# Patient Record
Sex: Male | Born: 1980 | Race: White | Hispanic: No | Marital: Married | Smoking: Current some day smoker
Health system: Southern US, Community
[De-identification: ages and names within clinical notes are randomized; demographics above are authoritative.]

## PROBLEM LIST (undated history)

## (undated) DIAGNOSIS — J45909 Unspecified asthma, uncomplicated: Secondary | ICD-10-CM

## (undated) HISTORY — PX: KNEE SURGERY: SHX244

---

## 2018-04-05 ENCOUNTER — Emergency Department (HOSPITAL_COMMUNITY)
Admission: EM | Admit: 2018-04-05 | Discharge: 2018-04-05 | Disposition: A | Discharge status: Home / Self Care | Payer: Non-veteran care | Attending: Emergency Medicine | Admitting: Emergency Medicine

## 2018-04-05 ENCOUNTER — Emergency Department (HOSPITAL_COMMUNITY): Payer: Non-veteran care

## 2018-04-05 ENCOUNTER — Encounter (HOSPITAL_COMMUNITY): Payer: Self-pay

## 2018-04-05 DIAGNOSIS — J45909 Unspecified asthma, uncomplicated: Secondary | ICD-10-CM | POA: Insufficient documentation

## 2018-04-05 DIAGNOSIS — R1013 Epigastric pain: Secondary | ICD-10-CM | POA: Insufficient documentation

## 2018-04-05 DIAGNOSIS — F17210 Nicotine dependence, cigarettes, uncomplicated: Secondary | ICD-10-CM | POA: Diagnosis not present

## 2018-04-05 DIAGNOSIS — R109 Unspecified abdominal pain: Secondary | ICD-10-CM | POA: Diagnosis not present

## 2018-04-05 HISTORY — DX: Unspecified asthma, uncomplicated: J45.909

## 2018-04-05 LAB — COMPREHENSIVE METABOLIC PANEL
ALK PHOS: 75 U/L (ref 38–126)
ALT: 102 U/L — ABNORMAL HIGH (ref 0–44)
ANION GAP: 11 (ref 5–15)
AST: 49 U/L — ABNORMAL HIGH (ref 15–41)
Albumin: 4.4 g/dL (ref 3.5–5.0)
BUN: 8 mg/dL (ref 6–20)
CALCIUM: 9.4 mg/dL (ref 8.9–10.3)
CO2: 24 mmol/L (ref 22–32)
Chloride: 105 mmol/L (ref 98–111)
Creatinine, Ser: 1.24 mg/dL (ref 0.61–1.24)
Glucose, Bld: 100 mg/dL — ABNORMAL HIGH (ref 70–99)
Potassium: 4 mmol/L (ref 3.5–5.1)
SODIUM: 140 mmol/L (ref 135–145)
TOTAL PROTEIN: 7.4 g/dL (ref 6.5–8.1)
Total Bilirubin: 1.6 mg/dL — ABNORMAL HIGH (ref 0.3–1.2)

## 2018-04-05 LAB — TYPE AND SCREEN
ABO/RH(D): B NEG
ANTIBODY SCREEN: NEGATIVE

## 2018-04-05 LAB — CBC
HCT: 51.2 % (ref 39.0–52.0)
HEMOGLOBIN: 17.5 g/dL — AB (ref 13.0–17.0)
MCH: 31.7 pg (ref 26.0–34.0)
MCHC: 34.2 g/dL (ref 30.0–36.0)
MCV: 92.8 fL (ref 78.0–100.0)
Platelets: 131 10*3/uL — ABNORMAL LOW (ref 150–400)
RBC: 5.52 MIL/uL (ref 4.22–5.81)
RDW: 12.9 % (ref 11.5–15.5)
WBC: 7 10*3/uL (ref 4.0–10.5)

## 2018-04-05 LAB — POC OCCULT BLOOD, ED: FECAL OCCULT BLD: NEGATIVE

## 2018-04-05 LAB — ABO/RH: ABO/RH(D): B NEG

## 2018-04-05 MED ORDER — FAMOTIDINE 20 MG PO TABS: 20.0000 mg | ORAL_TABLET | Freq: Two times a day (BID) | ORAL | 0 refills | Status: AC

## 2018-04-05 MED ORDER — SUCRALFATE 1 G PO TABS: 1.0000 g | ORAL_TABLET | Freq: Four times a day (QID) | ORAL | 0 refills | Status: AC | PRN

## 2018-04-05 MED ORDER — FAMOTIDINE IN NACL 20-0.9 MG/50ML-% IV SOLN
20.0000 mg | Freq: Once | INTRAVENOUS | Status: AC
  Administered 2018-04-05: 20 mg via INTRAVENOUS
  Filled 2018-04-05: qty 50

## 2018-04-05 MED ORDER — ONDANSETRON 4 MG PO TBDP
4.0000 mg | ORAL_TABLET | Freq: Once | ORAL | Status: AC
  Administered 2018-04-05: 4 mg via ORAL
  Filled 2018-04-05: qty 1

## 2018-04-05 MED ORDER — GI COCKTAIL ~~LOC~~
30.0000 mL | Freq: Once | ORAL | Status: AC
  Administered 2018-04-05: 30 mL via ORAL
  Filled 2018-04-05: qty 30

## 2018-04-05 NOTE — Discharge Instructions (Addendum)
You were evaluated in the Emergency Department and after careful evaluation, we did not find any emergent condition requiring admission or further testing in the hospital.  Your symptoms today seem to be due to inflammation of the stomach.  Your pain might also be related to sludge in her gallbladder.  Please use the medications as directed and follow-up with your primary care provider if not improved in 1 week.  Please return to the Emergency Department if you experience any worsening of your condition.  We encourage you to follow up with a primary care provider.  Thank you for allowing us to be a part of your care.

## 2018-04-05 NOTE — ED Notes (Signed)
Patient transported to Ultrasound 

## 2018-04-05 NOTE — ED Triage Notes (Signed)
Pt states that he has been having diarrhea for the past 8-9 unrelieved by home medications with upper abd pain, worse after eating. Reports n/v and black stools for the past 3-4 days

## 2018-04-05 NOTE — ED Provider Notes (Signed)
[facility] Provider Note MRN:  [id number]  Arrival date & time: 04/05/18     Chief Complaint   Abdominal Pain and Rectal Bleeding   History of Present Illness   Jeffery Sherman is a 37 y.o. year-old male with no pertinent past medical history presenting to the ED with chief complaint of abdominal pain.  The pain is located in the epigastrium.  The pain began 3 days ago, gradual onset, pain is constant but worsened by meals.  Preceded by 3 to 4 days of nonbloody diarrhea.  No recent antibiotics, no recent travel, denies fevers, no chest pain or shortness of breath.  States that a few days ago he began taking Pepto-Bismol.  Since that time his stool has become a darker more black-colored.  Denies dizziness or lightheadedness.  Review of Systems  A complete 10 system review of systems was obtained and all systems are negative except as noted in the HPI and PMH.   Patient's Health History    Past Medical History:  Diagnosis Date  . Asthma     Past Surgical History:  Procedure Laterality Date  . KNEE SURGERY      No family history on file.  Social History   Socioeconomic History  . Marital status: Married    Spouse name: Not on file  . Number of children: Not on file  . Years of education: Not on file  . Highest education level: Not on file  Occupational History  . Not on file  Social Needs  . Financial resource strain: Not on file  . Food insecurity:    Worry: Not on file    Inability: Not on file  . Transportation needs:    Medical: Not on file    Non-medical: Not on file  Tobacco Use  . Smoking status: Current Some Day Smoker  . Smokeless tobacco: Never Used  Substance and Sexual Activity  . Alcohol use: Yes  . Drug use: Never  . Sexual activity: Not on file  Lifestyle  . Physical activity:    Days per week: Not on file    Minutes per session: Not on file  . Stress: Not on file  Relationships  . Social connections:   Talks on phone: Not on file    Gets together: Not on file    Attends religious service: Not on file    Active member of club or organization: Not on file    Attends meetings of clubs or organizations: Not on file    Relationship status: Not on file  . Intimate partner violence:    Fear of current or ex partner: Not on file    Emotionally abused: Not on file    Physically abused: Not on file    Forced sexual activity: Not on file  Other Topics Concern  . Not on file  Social History Narrative  . Not on file     Physical Exam  Vital Signs and Nursing Notes reviewed Vitals:   04/05/18 2300 04/05/18 2315  BP:  131/85  Pulse: 69 (!) 58  Resp:  16  Temp:    SpO2: 97% 98%    CONSTITUTIONAL: Well-appearing, NAD NEURO:  Alert and oriented x 3, no focal deficits EYES:  eyes equal and reactive ENT/NECK:  no LAD, no JVD CARDIO: Regular rate, well-perfused, normal S1 and S2 PULM:  CTAB no wheezing or rhonchi GI/GU:  normal bowel sounds, non-distended, mild epigastric tenderness MSK/SPINE:  No gross deformities, no edema  SKIN:  no rash, atraumatic PSYCH:  Appropriate speech and behavior  Diagnostic and Interventional Summary    EKG Interpretation  Date/Time:    Ventricular Rate:    PR Interval:    QRS Duration:   QT Interval:    QTC Calculation:   R Axis:     Text Interpretation:        Labs Reviewed  COMPREHENSIVE METABOLIC PANEL - Abnormal; Notable for the following components:      Result Value   Glucose, Bld 100 (*)    AST 49 (*)    ALT 102 (*)    Total Bilirubin 1.6 (*)    All other components within normal limits  CBC - Abnormal; Notable for the following components:   Hemoglobin 17.5 (*)    Platelets 131 (*)    All other components within normal limits  POC OCCULT BLOOD, ED  POC OCCULT BLOOD, ED  TYPE AND SCREEN  ABO/RH    US Abdomen Limited RUQ  Final Result      Medications  ondansetron (ZOFRAN-ODT) disintegrating tablet 4 mg (4 mg Oral Given  04/05/18 2059)  famotidine (PEPCID) IVPB 20 mg premix (0 mg Intravenous Stopped 04/05/18 2309)  gi cocktail (Maalox,Lidocaine,Donnatal) (30 mLs Oral Given 04/05/18 2152)     Procedures Critical Care  ED Course and Medical Decision Making  I have reviewed the triage vital signs and the nursing notes.  Pertinent labs & imaging results that were available during my care of the patient were reviewed by me and considered in my medical decision making (see below for details). Clinical Course as of Apr 05 2321  Sat Apr 05, 2018  2522 37 year old male presenting with 1 week diarrhea, few days of epigastric abdominal pain worse with meals.  Question of gallbladder disease versus gastric ulcer.  Patient endorses more black stools lately but only after starting Pepto-Bismol.  Will obtain labs, right upper quadrant ultrasound, Hemoccult.  Very well-appearing, vital signs stable.   [MB]  2314 Labs reveal very mild elevation in LFTs, ultrasound reveals sludge in the gallbladder but no findings to suggest cholecystitis.  Hemoccult negative.  Little to no concern for significant GI bleed or gastric ulcer.  Biliary colic versus more benign gastritis.  Given prescription for Carafate and Pepcid.  Discussed the possibility of fatty liver disease and encouraged weight loss.  We will follow-up with his primary care provider in the next 1 to 2 weeks.After the discussed management above, the patient was determined to be safe for discharge.  The patient was in agreement with this plan and all questions regarding their care were answered.  ED return precautions were discussed and the patient will return to the ED with any significant worsening of condition.   [MB]    Clinical Course User Index [MB] Pilar Plate Elmer Sow, MD     Elmer Sow. Pilar Plate, MD [facility] Emergency Medicine [facility] [email or web address]  Final Clinical Impressions(s) / ED Diagnoses     ICD-10-CM   1. Epigastric pain R10.13   2.  Abdominal pain R10.9 US Abdomen Limited RUQ    US Abdomen Limited RUQ    ED Discharge Orders         Ordered    sucralfate (CARAFATE) 1 g tablet  4 times daily PRN     04/05/18 2312    famotidine (PEPCID) 20 MG tablet  2 times daily     04/05/18 2312  Sabas Sous, MD 04/05/18 2322

## 2018-12-30 IMAGING — US US ABDOMEN LIMITED
1 series · 14 of 25 positions shown · non-contrast
Comparison: None.

CLINICAL DATA: Abdominal pain for 1 week

EXAM:
ULTRASOUND ABDOMEN LIMITED RIGHT UPPER QUADRANT

[Series 1: us abdomen limited · 0.27mm/px · 14 of 68 slices shown]
[im 1/68]
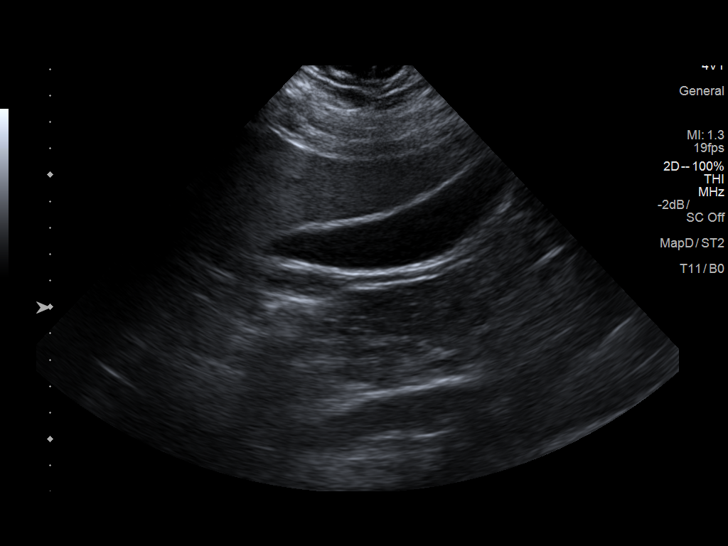
[im 6/68]
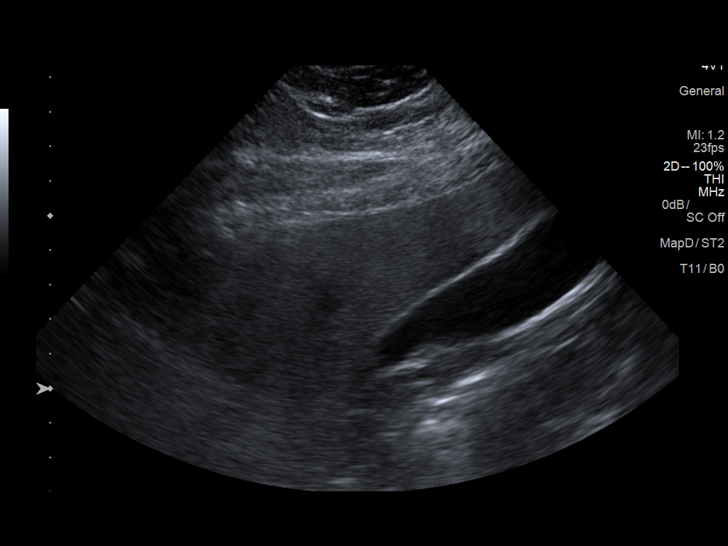
[im 12/68]
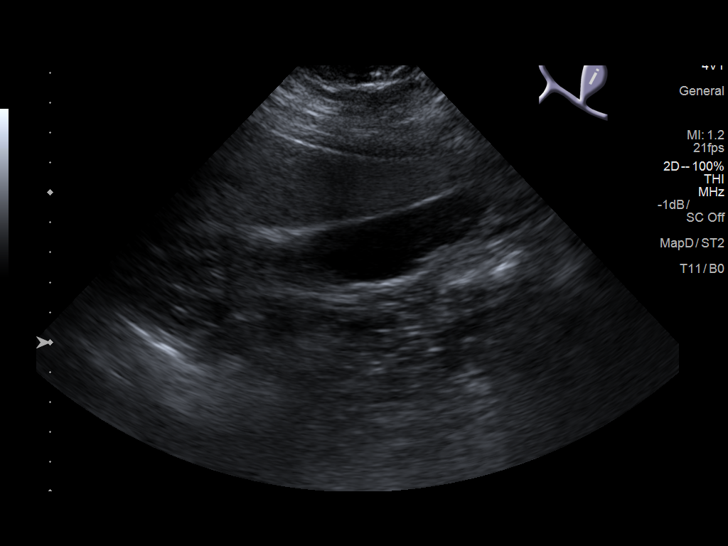
[im 17/68]
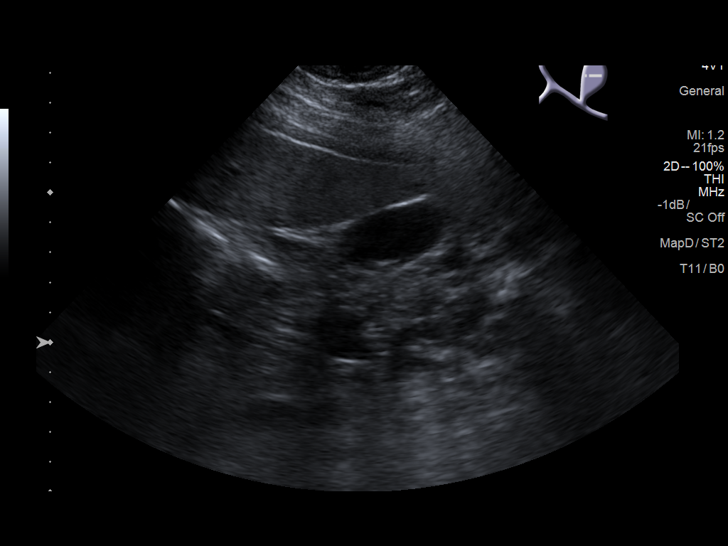
[im 23/68]
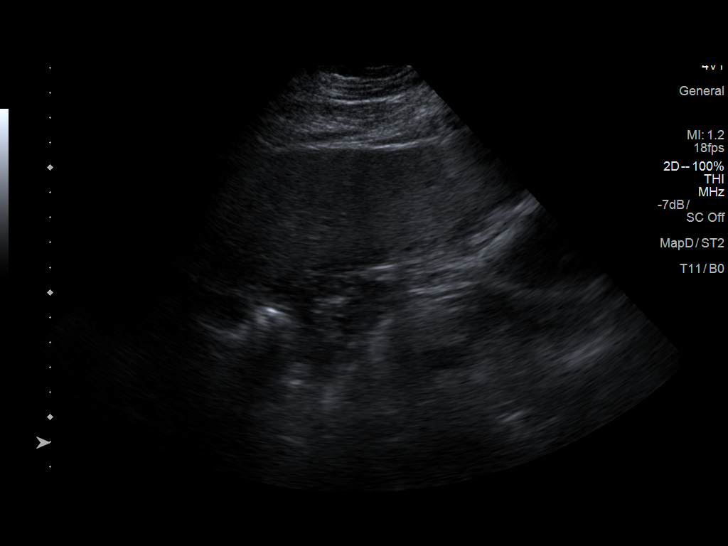
[im 26/68]
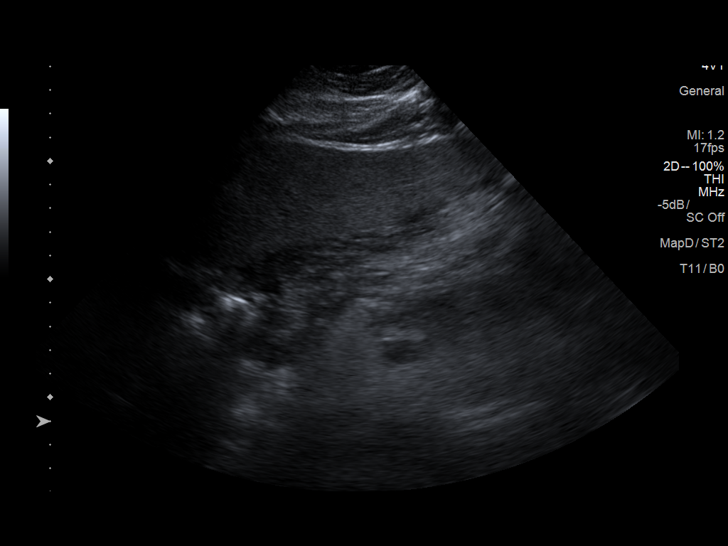
[im 31/68]
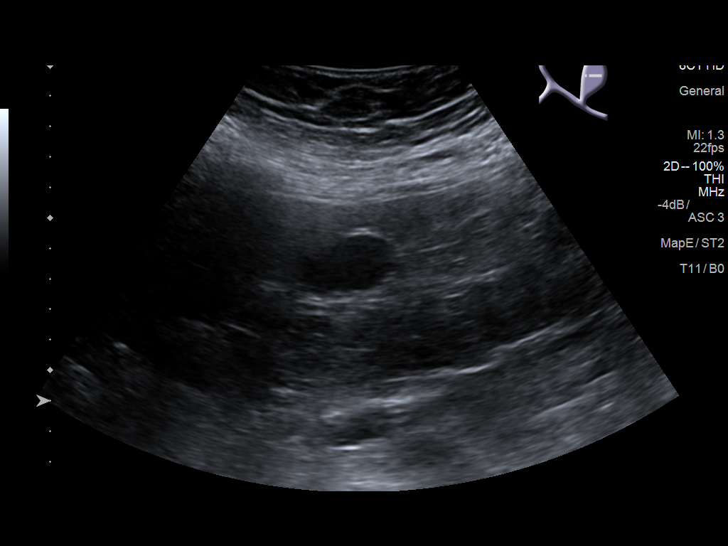
[im 37/68]
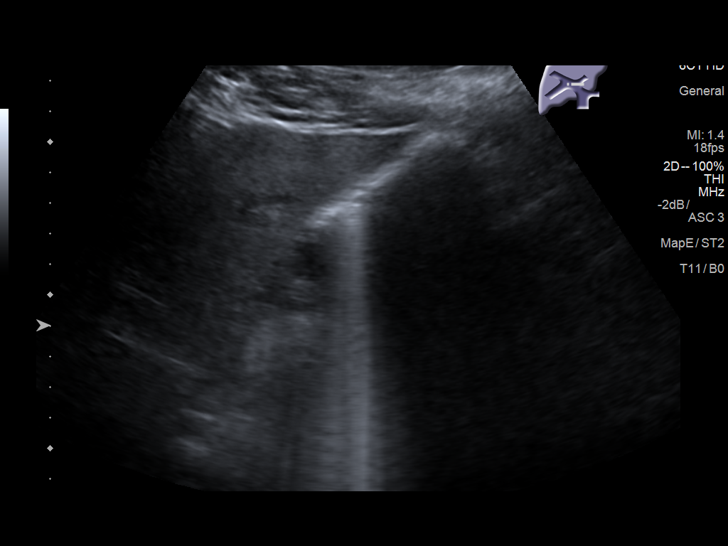
[im 42/68]
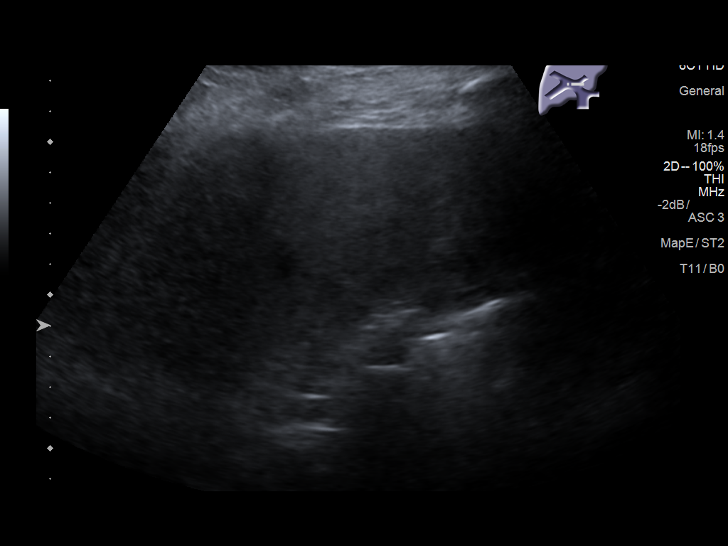
[im 45/68]
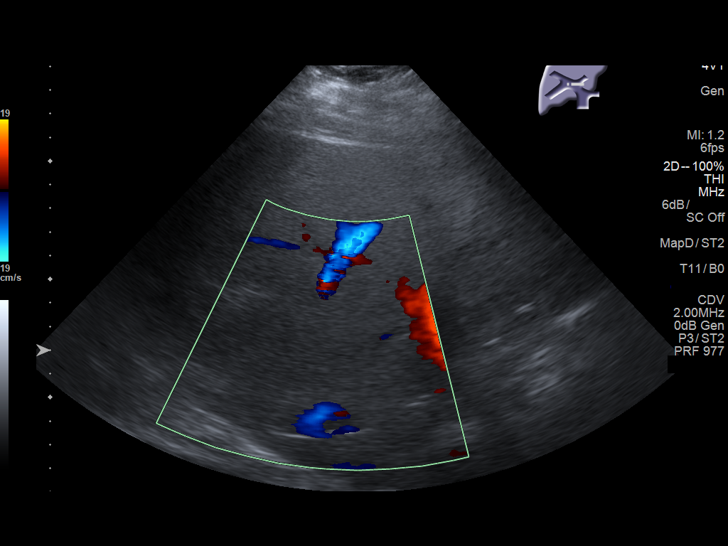
[im 51/68]
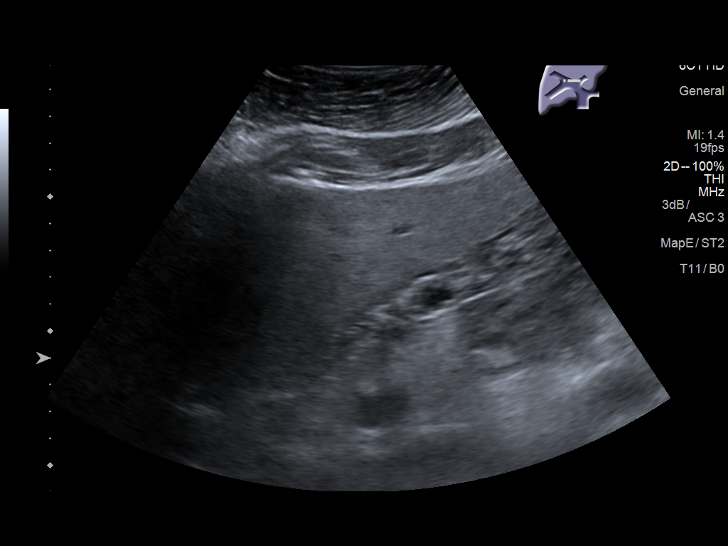
[im 56/68]
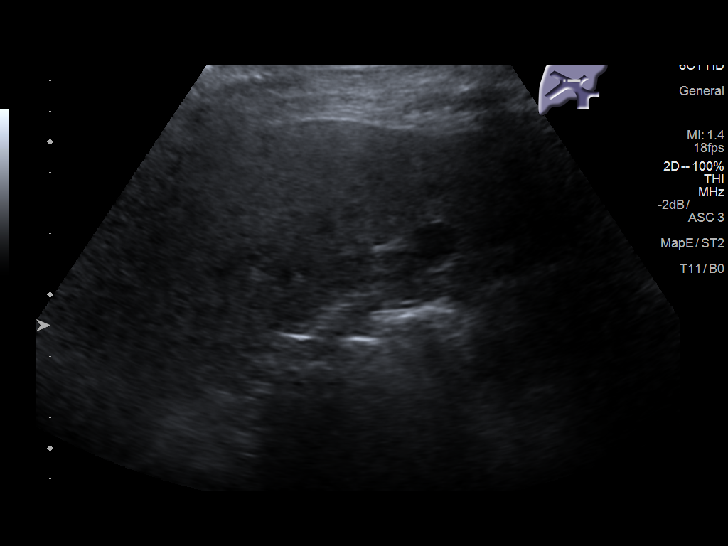
[im 62/68]
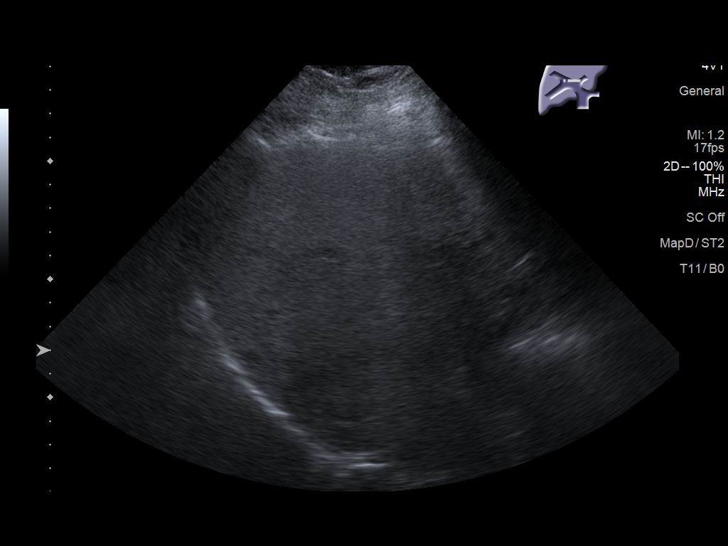
[im 68/68]
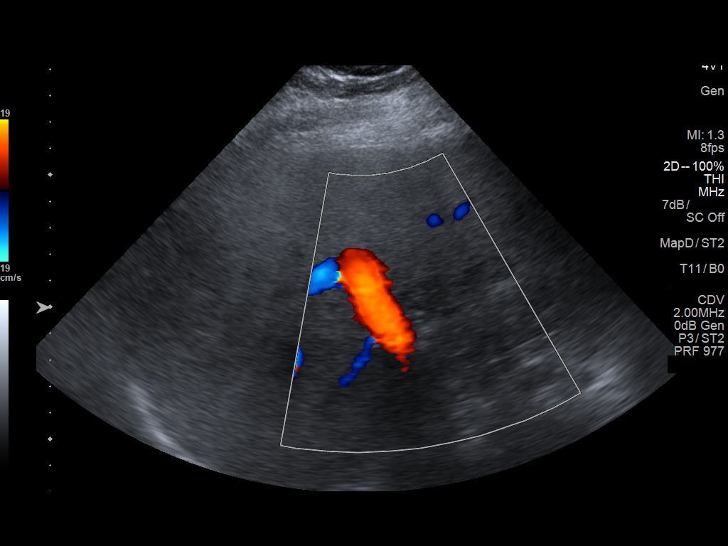

[14 of 25 positions shown; findings below may reference images not displayed]

FINDINGS: Gallbladder:

No gallstones or wall thickening visualized. Gallbladder sludge
noted. No sonographic Murphy sign noted by sonographer.

Common bile duct:

Diameter: 4.1 mm

Liver:

Increased parenchymal echogenicity. No focal abnormality. Portal
vein is patent on color Doppler imaging with normal direction of
blood flow towards the liver.
IMPRESSION: 1. Increased parenchymal echogenicity of the liver suggestive of
hepatic steatosis
2. Gallbladder sludge. No gallbladder wall thickening or other
secondary signs of acute cholecystitis.
